# Patient Record
Sex: Female | Born: 1965 | Race: Black or African American | Hispanic: No | Marital: Married | Smoking: Never smoker
Health system: Southern US, Community
[De-identification: ages and names within clinical notes are randomized; demographics above are authoritative.]

## PROBLEM LIST (undated history)

## (undated) HISTORY — PX: DILATION AND CURETTAGE OF UTERUS: SHX78

## (undated) HISTORY — PX: OTHER SURGICAL HISTORY: SHX169

## (undated) HISTORY — PX: LAPAROSCOPIC HYSTERECTOMY: SHX1926

## (undated) HISTORY — PX: HERNIA REPAIR: SHX51

## (undated) HISTORY — PX: ECTOPIC PREGNANCY SURGERY: SHX613

---

## 2020-05-23 ENCOUNTER — Encounter (HOSPITAL_COMMUNITY): Payer: Self-pay | Admitting: Emergency Medicine

## 2020-05-23 ENCOUNTER — Ambulatory Visit (HOSPITAL_COMMUNITY)
Admission: EM | Admit: 2020-05-23 | Discharge: 2020-05-23 | Disposition: A | Discharge status: Home / Self Care | Payer: BC Managed Care – PPO | Attending: Urgent Care | Admitting: Urgent Care

## 2020-05-23 DIAGNOSIS — R030 Elevated blood-pressure reading, without diagnosis of hypertension: Secondary | ICD-10-CM | POA: Insufficient documentation

## 2020-05-23 DIAGNOSIS — Z20822 Contact with and (suspected) exposure to covid-19: Secondary | ICD-10-CM | POA: Insufficient documentation

## 2020-05-23 DIAGNOSIS — Z1152 Encounter for screening for COVID-19: Secondary | ICD-10-CM

## 2020-05-23 LAB — SARS CORONAVIRUS 2 (TAT 6-24 HRS): SARS Coronavirus 2: NEGATIVE

## 2020-05-23 NOTE — ED Provider Notes (Signed)
  [facility]   MRN: [id number] DOB: Apr 03, 1966  Subjective:   Rachel Lynn is a 53 y.o. female presenting for COVID screening. Patient is traveling to [facility].   Taking a weight loss medication.  Allergies  Allergen Reactions  . Hydrocodone     History reviewed. No pertinent past medical history.   History reviewed. No pertinent surgical history.  History reviewed. No pertinent family history.  Social History   Tobacco Use  . Smoking status: Never Smoker  . Smokeless tobacco: Never Used  Substance Use Topics  . Alcohol use: Not Currently  . Drug use: Not on file    Review of Systems  Constitutional: Negative for fever and malaise/fatigue.  HENT: Negative for congestion, ear pain, sinus pain and sore throat.   Eyes: Negative for discharge and redness.  Respiratory: Negative for cough, hemoptysis, shortness of breath and wheezing.   Cardiovascular: Negative for chest pain.  Gastrointestinal: Negative for abdominal pain, diarrhea, nausea and vomiting.  Genitourinary: Negative for dysuria, flank pain and hematuria.  Musculoskeletal: Negative for myalgias.  Skin: Negative for rash.  Neurological: Negative for dizziness, weakness and headaches.  Psychiatric/Behavioral: Negative for depression and substance abuse.     Objective:   Vitals: BP (!) 164/121 (BP Location: Right Arm)   Pulse (!) 104   Temp 98.5 F (36.9 C) (Oral)   Resp 20   SpO2 99%   Wt Readings from Last 3 Encounters:  No data found for Wt   Temp Readings from Last 3 Encounters:  05/23/20 98.5 F (36.9 C) (Oral)   BP Readings from Last 3 Encounters:  05/23/20 (!) 164/121   Pulse Readings from Last 3 Encounters:  05/23/20 (!) 104   BP recheck was 138/91. Pulse was 98.   Physical Exam Constitutional:      General: She is not in acute distress.    Appearance: Normal appearance. She is well-developed. She is not ill-appearing.  HENT:     Head: Normocephalic and atraumatic.       Nose: Nose normal.     Mouth/Throat:     Mouth: Mucous membranes are moist.     Pharynx: Oropharynx is clear.  Eyes:     General: No scleral icterus.    Extraocular Movements: Extraocular movements intact.     Pupils: Pupils are equal, round, and reactive to light.  Cardiovascular:     Rate and Rhythm: Normal rate.  Pulmonary:     Effort: Pulmonary effort is normal.  Skin:    General: Skin is warm and dry.  Neurological:     General: No focal deficit present.     Mental Status: She is alert and oriented to person, place, and time.  Psychiatric:        Mood and Affect: Mood normal.        Behavior: Behavior normal.      Assessment and Plan :   PDMP not reviewed this encounter.  1. Encounter for screening for COVID-19   2. Elevated blood pressure reading     Suspect elevated blood pressure and borderline tachycardia is related to her weight loss medication. Counseled patient on nature of COVID-19 including modes of transmission, diagnostic testing, management and supportive care.  Counseled on medications used for symptomatic relief. COVID 19 testing is pending. Counseled patient on potential for adverse effects with medications prescribed/recommended today, ER and return-to-clinic precautions discussed, patient verbalized understanding.     Wallis Bamberg, [place] 05/23/20 1943

## 2020-05-23 NOTE — ED Triage Notes (Signed)
Pt needs covid test for travel to [facility] on[facility].

## 2020-05-23 NOTE — Discharge Instructions (Signed)

## 2020-08-03 ENCOUNTER — Other Ambulatory Visit: Payer: Self-pay

## 2020-08-03 ENCOUNTER — Ambulatory Visit (HOSPITAL_COMMUNITY)
Admission: EM | Admit: 2020-08-03 | Discharge: 2020-08-03 | Disposition: A | Discharge status: Home / Self Care | Payer: BC Managed Care – PPO | Attending: Family Medicine | Admitting: Family Medicine

## 2020-08-03 DIAGNOSIS — Z20822 Contact with and (suspected) exposure to covid-19: Secondary | ICD-10-CM | POA: Diagnosis not present

## 2020-08-03 NOTE — ED Triage Notes (Signed)
Pt presents with needing covid testing for exposure, denies symptoms.

## 2020-08-03 NOTE — Discharge Instructions (Signed)

## 2020-08-04 LAB — SARS CORONAVIRUS 2 (TAT 6-24 HRS): SARS Coronavirus 2: NEGATIVE

## 2020-08-21 ENCOUNTER — Ambulatory Visit (HOSPITAL_COMMUNITY)
Admission: EM | Admit: 2020-08-21 | Discharge: 2020-08-21 | Disposition: A | Discharge status: Home / Self Care | Payer: BC Managed Care – PPO | Attending: Family Medicine | Admitting: Family Medicine

## 2020-08-21 ENCOUNTER — Other Ambulatory Visit: Payer: Self-pay

## 2020-08-21 ENCOUNTER — Encounter (HOSPITAL_COMMUNITY): Payer: Self-pay | Admitting: *Deleted

## 2020-08-21 ENCOUNTER — Ambulatory Visit (INDEPENDENT_AMBULATORY_CARE_PROVIDER_SITE_OTHER): Payer: BC Managed Care – PPO

## 2020-08-21 DIAGNOSIS — R0789 Other chest pain: Secondary | ICD-10-CM | POA: Diagnosis not present

## 2020-08-21 DIAGNOSIS — M546 Pain in thoracic spine: Secondary | ICD-10-CM | POA: Diagnosis not present

## 2020-08-21 DIAGNOSIS — R079 Chest pain, unspecified: Secondary | ICD-10-CM | POA: Diagnosis not present

## 2020-08-21 MED ORDER — NAPROXEN 500 MG PO TABS
500.0000 mg | ORAL_TABLET | Freq: Two times a day (BID) | ORAL | 0 refills | Status: DC
Start: 2020-08-21 — End: 2020-09-19

## 2020-08-21 MED ORDER — KETOROLAC TROMETHAMINE 30 MG/ML IJ SOLN
30.0000 mg | Freq: Once | INTRAMUSCULAR | Status: AC
  Administered 2020-08-21: 30 mg via INTRAMUSCULAR

## 2020-08-21 MED ORDER — TIZANIDINE HCL 4 MG PO TABS
4.0000 mg | ORAL_TABLET | Freq: Four times a day (QID) | ORAL | 0 refills | Status: AC | PRN
Start: 2020-08-21 — End: ?

## 2020-08-21 MED ORDER — KETOROLAC TROMETHAMINE 30 MG/ML IJ SOLN
INTRAMUSCULAR | Status: AC
  Filled 2020-08-21: qty 1

## 2020-08-21 NOTE — ED Notes (Signed)
EKG given to Dr. Hagler 

## 2020-08-21 NOTE — Discharge Instructions (Signed)
EKG and chest xray normal We gave you toradol for pain Continue with Naprosyn twice daily with food for pain May supplement with tizanidine as needed at home/bedtime, this is a muscle relaxer Gentle stretching and movement of shoulder neck and back Follow-up if not improving or worsening

## 2020-08-21 NOTE — ED Triage Notes (Signed)
Patient in with complaints of right sided chest pain that radiates to back since Saturday. Patient states that pain increases with moving and breathing. Patient states she does have some SOB. Patient denies recent injury. Biofreeze on back and tylenol taken at home.

## 2020-08-21 NOTE — ED Provider Notes (Signed)
[facility]    CSN: [id number] Arrival date & time: 08/21/20  6270      History   Chief Complaint Chief Complaint  Patient presents with   Chest Pain   Shoulder Pain    HPI Rachel Lynn is a 54 y.o. female presenting today for evaluation of chest and shoulder pain.  Patient reports she has had pain in the right side of chest since Saturday and has persisted over the past 2 days.  Pain worse with breathing and movement.  Has had some shortness of breath.  Denies any injury/straining. Symptoms worsening.  Worse with moving right shoulder.  Has tried Tylenol and Biofreeze without relief.  She denies any associate abdominal pain nausea or vomiting.  Did burp, but that worsened the pain at the time, did not feel any relief after.  Denies any correlation to eating.  Denies any recent cough or fevers.  Denies prior DVT/PE.  Denies leg pain or leg swelling.  Does report being on hormone replacement therapy after hysterectomy, but unsure if estrogen.  Denies history of hypertension, diabetes.  Denies tobacco use.  HPI  History reviewed. No pertinent past medical history.  There are no problems to display for this patient.   Past Surgical History:  Procedure Laterality Date   ECTOPIC PREGNANCY SURGERY     HERNIA REPAIR     LAPAROSCOPIC HYSTERECTOMY      OB History   No obstetric history on file.      Home Medications    Prior to Admission medications   Medication Sig Start Date End Date Taking? Authorizing Provider  Ascorbic Acid (VITAMIN C) 100 MG tablet Take 500 mg by mouth daily.   Yes [provider]  cholecalciferol (VITAMIN D3) 25 MCG (1000 UNIT) tablet Take 2,000 Units by mouth daily.   Yes [provider]  estradiol (ESTRACE) 1 MG tablet Take 2 mg by mouth daily.   Yes [provider]  Multiple Vitamin (MULTIVITAMIN WITH MINERALS) TABS tablet Take 1 tablet by mouth daily.   Yes [provider]  NON FORMULARY 2 mg.  Hormone pill name unknown   Yes [provider]  progesterone (ENDOMETRIN) 100 MG vaginal insert Place 100 mg vaginally daily.   Yes [provider]  naproxen (NAPROSYN) 500 MG tablet Take 1 tablet (500 mg total) by mouth 2 (two) times daily. 08/21/20   Zyier Dykema C, PA-C  tiZANidine (ZANAFLEX) 4 MG tablet Take 1 tablet (4 mg total) by mouth every 6 (six) hours as needed for muscle spasms. 08/21/20   Alvina Strother, Junius Creamer, PA-C    Family History Family History  Problem Relation Age of Onset   Hypertension Mother    Heart failure Mother    Hypertension Father    Heart failure Father     Social History Social History   Tobacco Use   Smoking status: Never Smoker   Smokeless tobacco: Never Used  Building services engineer Use: Never used  Substance Use Topics   Alcohol use: Yes    Comment: occ   Drug use: Never     Allergies   Hydrocodone   Review of Systems Review of Systems  Constitutional: Negative for activity change, appetite change, chills, fatigue and fever.  HENT: Negative for congestion, ear pain, rhinorrhea, sinus pressure, sore throat and trouble swallowing.   Eyes: Negative for discharge and redness.  Respiratory: Positive for shortness of breath. Negative for cough and chest tightness.   Cardiovascular: Positive for chest  pain.  Gastrointestinal: Negative for abdominal pain, diarrhea, nausea and vomiting.  Musculoskeletal: Positive for back pain and myalgias.  Skin: Negative for rash.  Neurological: Negative for dizziness, light-headedness and headaches.     Physical Exam Triage Vital Signs ED Triage Vitals [08/21/20 0956]  Enc Vitals Group     BP (!) 166/96     Pulse Rate 95     Resp 16     Temp 97.8 F (36.6 C)     Temp Source Oral     SpO2 100 %     Weight      Height      Head Circumference      Peak Flow      Pain Score      Pain Loc      Pain Edu?      Excl. in GC?    No data found.  Updated Vital Signs BP (!)  166/96 (BP Location: Right Arm)    Pulse 95    Temp 97.8 F (36.6 C) (Oral)    Resp 16    SpO2 100%   Visual Acuity Right Eye Distance:   Left Eye Distance:   Bilateral Distance:    Right Eye Near:   Left Eye Near:    Bilateral Near:     Physical Exam Vitals and nursing note reviewed.  Constitutional:      Appearance: She is well-developed.     Comments: No acute distress  HENT:     Head: Normocephalic and atraumatic.     Ears:     Comments: Bilateral ears without tenderness to palpation of external auricle, tragus and mastoid, EAC's without erythema or swelling, TM's with good bony landmarks and cone of light. Non erythematous.     Nose: Nose normal.     Mouth/Throat:     Comments: Oral mucosa pink and moist, no tonsillar enlargement or exudate. Posterior pharynx patent and nonerythematous, no uvula deviation or swelling. Normal phonation. Eyes:     Conjunctiva/sclera: Conjunctivae normal.  Cardiovascular:     Rate and Rhythm: Normal rate.  Pulmonary:     Effort: Pulmonary effort is normal. No respiratory distress.     Comments: Breathing comfortably at rest, CTABL, no wheezing, rales or other adventitious sounds auscultated  Right anterior chest diffusely tender to palpation Chest:     Chest wall: Tenderness present.  Abdominal:     General: There is no distension.  Musculoskeletal:        General: Normal range of motion.     Cervical back: Neck supple.     Comments: Right shoulder: Nontender palpation along clavicle, AC joint and scapular spine, limited range of motion of shoulder due to pain, full passive range of motion  Tenderness to palpation to periscapular area  Bilateral lower legs symmetric without calf tenderness or swelling  Skin:    General: Skin is warm and dry.     Comments: No overlying rash or discoloration  Neurological:     Mental Status: She is alert and oriented to person, place, and time.      UC Treatments / Results  Labs (all labs  ordered are listed, but only abnormal results are displayed) Labs Reviewed - No data to display  EKG   Radiology DG Chest 2 View  Result Date: 08/21/2020 CLINICAL DATA:  Right chest and back pain for 3 days. EXAM: CHEST - 2 VIEW COMPARISON:  PA and lateral chest 08/24/2009. FINDINGS: The lungs are clear. Heart size is normal.  No pneumothorax or pleural fluid. No acute or focal bony abnormality. IMPRESSION: Negative chest. Electronically Signed   By: Drusilla Kanner M.D.   On: 08/21/2020 11:39    Procedures Procedures (including critical care time)  Medications Ordered in UC Medications  ketorolac (TORADOL) 30 MG/ML injection 30 mg (has no administration in time range)    Initial Impression / Assessment and Plan / UC Course  I have reviewed the triage vital signs and the nursing notes.  Pertinent labs & imaging results that were available during my care of the patient were reviewed by me and considered in my medical decision making (see chart for details).  Clinical Course as of Aug 21 1146  Mon Aug 21, 2020  1020 EKG normal sinus rhythm, 1 PVC, no acute signs of ischemia or infraction   [HW]  1109 BP rechecked 148/94   [HW]    Clinical Course User Index [HW] Shyann Hefner C, PA-C    Symptoms worse with movement and significantly reproducible to palpation, suspect MSK etiology and recommending anti-inflammatories.  Toradol provided prior to discharge.  Checking chest x-ray given symptoms worsening and did have sudden onset.  No hypoxia or tachycardia, no lower leg swelling, lower suspicion of PE.  EKG reassuring, less likely ACS.  Chest x-ray unremarkable.  Continue to monitor,Discussed strict return precautions. Patient verbalized understanding and is agreeable with plan.  Final Clinical Impressions(s) / UC Diagnoses   Final diagnoses:  Chest wall pain     Discharge Instructions     EKG and chest xray normal We gave you toradol for pain Continue with Naprosyn twice  daily with food for pain May supplement with tizanidine as needed at home/bedtime, this is a muscle relaxer Gentle stretching and movement of shoulder neck and back Follow-up if not improving or worsening    ED Prescriptions    Medication Sig Dispense Auth. Provider   naproxen (NAPROSYN) 500 MG tablet Take 1 tablet (500 mg total) by mouth 2 (two) times daily. 30 tablet Kayler Buckholtz C, PA-C   tiZANidine (ZANAFLEX) 4 MG tablet Take 1 tablet (4 mg total) by mouth every 6 (six) hours as needed for muscle spasms. 30 tablet Elyce Zollinger, [place] C, PA-C     PDMP not reviewed this encounter.   Lew Dawes, PA-C 08/21/20 1147

## 2020-08-23 ENCOUNTER — Other Ambulatory Visit: Payer: Self-pay

## 2020-08-23 ENCOUNTER — Encounter (HOSPITAL_COMMUNITY): Payer: Self-pay

## 2020-08-23 ENCOUNTER — Ambulatory Visit (HOSPITAL_COMMUNITY)
Admission: EM | Admit: 2020-08-23 | Discharge: 2020-08-23 | Disposition: A | Discharge status: Home / Self Care | Payer: BC Managed Care – PPO | Attending: Internal Medicine | Admitting: Internal Medicine

## 2020-08-23 DIAGNOSIS — J069 Acute upper respiratory infection, unspecified: Secondary | ICD-10-CM | POA: Diagnosis not present

## 2020-08-23 DIAGNOSIS — Z791 Long term (current) use of non-steroidal anti-inflammatories (NSAID): Secondary | ICD-10-CM | POA: Diagnosis not present

## 2020-08-23 DIAGNOSIS — Z20822 Contact with and (suspected) exposure to covid-19: Secondary | ICD-10-CM | POA: Diagnosis not present

## 2020-08-23 DIAGNOSIS — Z79899 Other long term (current) drug therapy: Secondary | ICD-10-CM | POA: Diagnosis not present

## 2020-08-23 LAB — SARS CORONAVIRUS 2 (TAT 6-24 HRS): SARS Coronavirus 2: NEGATIVE

## 2020-08-23 MED ORDER — ALBUTEROL SULFATE HFA 108 (90 BASE) MCG/ACT IN AERS: 1.0000 | INHALATION_SPRAY | Freq: Four times a day (QID) | RESPIRATORY_TRACT | 0 refills | Status: AC | PRN

## 2020-08-23 MED ORDER — PROMETHAZINE-DM 6.25-15 MG/5ML PO SYRP
5.0000 mL | ORAL_SOLUTION | Freq: Four times a day (QID) | ORAL | 0 refills | Status: AC | PRN
Start: 2020-08-23 — End: ?

## 2020-08-23 NOTE — ED Triage Notes (Signed)
Pt presents with ongoing non productive cough, shortness of breath, and chest pain when coughing for over a week.

## 2020-08-23 NOTE — ED Provider Notes (Signed)
[facility]    CSN: [id number] Arrival date & time: 08/23/20  1220      History   Chief Complaint Chief Complaint  Patient presents with  . Cough  . Shortness of Breath    HPI Rachel Lynn is a 54 y.o. female.   Patient presenting today for about 4 days of hacking cough, SOB, congestion, fatigue. Denies fever, chills, body aches, N/V/D. Was seen for radiating chest to back pain this past weekend and given muscle relaxer and naproxen, notes this is improved but her breathing has gotten worse. EKG and CXR at that time benign. Denies known hx of asthma, COPD and nonsmoker. Known sick contacts recently. So far only taking vitamins and supplements for these sxs.        History reviewed. No pertinent past medical history.  There are no problems to display for this patient.   Past Surgical History:  Procedure Laterality Date  . ECTOPIC PREGNANCY SURGERY    . HERNIA REPAIR    . LAPAROSCOPIC HYSTERECTOMY      OB History   No obstetric history on file.      Home Medications    Prior to Admission medications   Medication Sig Start Date End Date Taking? Authorizing Provider  albuterol (VENTOLIN HFA) 108 (90 Base) MCG/ACT inhaler Inhale 1-2 puffs into the lungs every 6 (six) hours as needed for wheezing or shortness of breath. 08/23/20   Particia Nearing, PA-C  Ascorbic Acid (VITAMIN C) 100 MG tablet Take 500 mg by mouth daily.    [provider]  cholecalciferol (VITAMIN D3) 25 MCG (1000 UNIT) tablet Take 2,000 Units by mouth daily.    [provider]  estradiol (ESTRACE) 1 MG tablet Take 2 mg by mouth daily.    [provider]  Multiple Vitamin (MULTIVITAMIN WITH MINERALS) TABS tablet Take 1 tablet by mouth daily.    [provider]  naproxen (NAPROSYN) 500 MG tablet Take 1 tablet (500 mg total) by mouth 2 (two) times daily. 08/21/20   Wieters, Hallie C, PA-C  NON FORMULARY 2 mg. Hormone pill name unknown    [provider]  progesterone (ENDOMETRIN) 100 MG vaginal insert Place 100 mg vaginally daily.    [provider]  promethazine-dextromethorphan (PROMETHAZINE-DM) 6.25-15 MG/5ML syrup Take 5 mLs by mouth 4 (four) times daily as needed for cough. 08/23/20   Particia Nearing, PA-C  tiZANidine (ZANAFLEX) 4 MG tablet Take 1 tablet (4 mg total) by mouth every 6 (six) hours as needed for muscle spasms. 08/21/20   Wieters, Junius Creamer, PA-C    Family History Family History  Problem Relation Age of Onset  . Hypertension Mother   . Heart failure Mother   . Hypertension Father   . Heart failure Father     Social History Social History   Tobacco Use  . Smoking status: Never Smoker  . Smokeless tobacco: Never Used  Vaping Use  . Vaping Use: Never used  Substance Use Topics  . Alcohol use: Yes    Comment: occ  . Drug use: Never     Allergies   Hydrocodone   Review of Systems Review of Systems PER HPI    Physical Exam Triage Vital Signs ED Triage Vitals  Enc Vitals Group     BP 08/23/20 1246 (!) 155/93     Pulse Rate 08/23/20 1246 (!) 103     Resp 08/23/20 1246 (!) 22     Temp 08/23/20 1246 99 F (  37.2 C)     Temp Source 08/23/20 1246 Oral     SpO2 08/23/20 1246 100 %     Weight --      Height --      Head Circumference --      Peak Flow --      Pain Score 08/23/20 1248 5     Pain Loc --      Pain Edu? --      Excl. in GC? --    No data found.  Updated Vital Signs BP (!) 155/93 (BP Location: Right Arm)   Pulse (!) 103   Temp 99 F (37.2 C) (Oral)   Resp (!) 22   SpO2 100%   Visual Acuity Right Eye Distance:   Left Eye Distance:   Bilateral Distance:    Right Eye Near:   Left Eye Near:    Bilateral Near:     Physical Exam Vitals and nursing note reviewed.  Constitutional:      Appearance: Normal appearance. She is not ill-appearing.  HENT:     Head: Atraumatic.     Right Ear: Tympanic membrane normal.     Left Ear: Tympanic membrane  normal.     Nose: Rhinorrhea present.     Mouth/Throat:     Mouth: Mucous membranes are moist.     Pharynx: Oropharynx is clear. Posterior oropharyngeal erythema present.  Eyes:     Extraocular Movements: Extraocular movements intact.     Conjunctiva/sclera: Conjunctivae normal.  Cardiovascular:     Rate and Rhythm: Normal rate and regular rhythm.     Heart sounds: Normal heart sounds.  Pulmonary:     Effort: Pulmonary effort is normal.     Breath sounds: Normal breath sounds.  Abdominal:     General: Bowel sounds are normal. There is no distension.     Palpations: Abdomen is soft.     Tenderness: There is no abdominal tenderness. There is no guarding.  Musculoskeletal:        General: Normal range of motion.     Cervical back: Normal range of motion and neck supple.  Skin:    General: Skin is warm and dry.  Neurological:     Mental Status: She is alert and oriented to person, place, and time.  Psychiatric:        Mood and Affect: Mood normal.        Thought Content: Thought content normal.        Judgment: Judgment normal.    UC Treatments / Results  Labs (all labs ordered are listed, but only abnormal results are displayed) Labs Reviewed  SARS CORONAVIRUS 2 (TAT 6-24 HRS)    EKG   Radiology No results found.  Procedures Procedures (including critical care time)  Medications Ordered in UC Medications - No data to display  Initial Impression / Assessment and Plan / UC Course  I have reviewed the triage vital signs and the nursing notes.  Pertinent labs & imaging results that were available during my care of the patient were reviewed by me and considered in my medical decision making (see chart for details).     Suspect COVID 19 given sxs and exam as well as recent COVID pos contacts. WIll tx with albuterol inhaler and phenergan DM, mucinex. Isolate until COVID test results return. Strict return precautions reviewed with patient.    Final Clinical  Impressions(s) / UC Diagnoses   Final diagnoses:  Viral URI with cough   Discharge Instructions  None    ED Prescriptions    Medication Sig Dispense Auth. Provider   albuterol (VENTOLIN HFA) 108 (90 Base) MCG/ACT inhaler Inhale 1-2 puffs into the lungs every 6 (six) hours as needed for wheezing or shortness of breath. 18 g Roosvelt Maser [place], [place]   promethazine-dextromethorphan (PROMETHAZINE-DM) 6.25-15 MG/5ML syrup Take 5 mLs by mouth 4 (four) times daily as needed for cough. 100 mL Particia Nearing, [place]     PDMP not reviewed this encounter.   Particia Nearing, [place] 08/23/20 1540

## 2020-09-19 ENCOUNTER — Encounter: Payer: Self-pay | Admitting: Emergency Medicine

## 2020-09-19 ENCOUNTER — Ambulatory Visit (INDEPENDENT_AMBULATORY_CARE_PROVIDER_SITE_OTHER): Payer: BC Managed Care – PPO

## 2020-09-19 ENCOUNTER — Other Ambulatory Visit: Payer: Self-pay

## 2020-09-19 ENCOUNTER — Ambulatory Visit
Admission: EM | Admit: 2020-09-19 | Discharge: 2020-09-19 | Disposition: A | Discharge status: Home / Self Care | Payer: BC Managed Care – PPO | Attending: Emergency Medicine | Admitting: Emergency Medicine

## 2020-09-19 DIAGNOSIS — W19XXXA Unspecified fall, initial encounter: Secondary | ICD-10-CM

## 2020-09-19 DIAGNOSIS — M79601 Pain in right arm: Secondary | ICD-10-CM | POA: Diagnosis not present

## 2020-09-19 DIAGNOSIS — M7711 Lateral epicondylitis, right elbow: Secondary | ICD-10-CM

## 2020-09-19 MED ORDER — IBUPROFEN 800 MG PO TABS
800.0000 mg | ORAL_TABLET | Freq: Three times a day (TID) | ORAL | 0 refills | Status: DC
Start: 2020-09-19 — End: 2020-10-10

## 2020-09-19 MED ORDER — PREDNISONE 10 MG PO TABS: ORAL_TABLET | ORAL | 0 refills | Status: AC

## 2020-09-19 NOTE — ED Triage Notes (Signed)
Pain to RT forearm with movement since the beginning of the month.  Pt states she used her arm to break a fall and has had pain that has continued to get worse.

## 2020-09-19 NOTE — Discharge Instructions (Addendum)
Begin prednisone taper over the next 6 days-begin with 6 tablets on day 1, decrease by 1 tablet each day until complete-6, 5, 4, 3, 2, 1-take with food and in the morning After completion of prednisone course may use Tylenol/ibuprofen Ice and rest arm Wear brace over the next 1 to 2 weeks to help decrease inflammation around tendons of forearm Follow-up with sports medicine if not improving or worsening

## 2020-09-19 NOTE — ED Provider Notes (Signed)
[facility]    CSN: [id number] Arrival date & time: 09/19/20  1138      History   Chief Complaint Chief Complaint  Patient presents with   Arm Injury    HPI Rachel Lynn is a 54 y.o. female presenting today for evaluation of forearm injury.  Patient reports at the beginning of the month approximately 20 days ago she reached out to help prevent someone else from falling.  Denies immediate pain, but since she has developed persistent and continuous pain to her proximal forearm near her elbow.  Reports pain with supination and pronation as well as movement at her wrist.  Denies numbness or tingling.  HPI  History reviewed. No pertinent past medical history.  There are no problems to display for this patient.   Past Surgical History:  Procedure Laterality Date   ECTOPIC PREGNANCY SURGERY     HERNIA REPAIR     LAPAROSCOPIC HYSTERECTOMY      OB History   No obstetric history on file.      Home Medications    Prior to Admission medications   Medication Sig Start Date End Date Taking? Authorizing Provider  albuterol (VENTOLIN HFA) 108 (90 Base) MCG/ACT inhaler Inhale 1-2 puffs into the lungs every 6 (six) hours as needed for wheezing or shortness of breath. 08/23/20   Particia Nearing, PA-C  Ascorbic Acid (VITAMIN C) 100 MG tablet Take 500 mg by mouth daily.    [provider]  cholecalciferol (VITAMIN D3) 25 MCG (1000 UNIT) tablet Take 2,000 Units by mouth daily.    [provider]  estradiol (ESTRACE) 1 MG tablet Take 2 mg by mouth daily.    [provider]  ibuprofen (ADVIL) 800 MG tablet Take 1 tablet (800 mg total) by mouth 3 (three) times daily. 09/19/20   Tymarion Everard C, PA-C  Multiple Vitamin (MULTIVITAMIN WITH MINERALS) TABS tablet Take 1 tablet by mouth daily.    [provider]  NON FORMULARY 2 mg. Hormone pill name unknown    [provider]  predniSONE (DELTASONE) 10 MG tablet Begin with 6  tabs on day 1, 5 tab on day 2, 4 tab on day 3, 3 tab on day 4, 2 tab on day 5, 1 tab on day 6-take with food 09/19/20   Paige Vanderwoude, [employer] C, PA-C  progesterone (ENDOMETRIN) 100 MG vaginal insert Place 100 mg vaginally daily.    [provider]  promethazine-dextromethorphan (PROMETHAZINE-DM) 6.25-15 MG/5ML syrup Take 5 mLs by mouth 4 (four) times daily as needed for cough. 08/23/20   Particia Nearing, PA-C  tiZANidine (ZANAFLEX) 4 MG tablet Take 1 tablet (4 mg total) by mouth every 6 (six) hours as needed for muscle spasms. 08/21/20   Lennell Shanks, Junius Creamer, PA-C    Family History Family History  Problem Relation Age of Onset   Hypertension Mother    Heart failure Mother    Hypertension Father    Heart failure Father     Social History Social History   Tobacco Use   Smoking status: Never Smoker   Smokeless tobacco: Never Used  Building services engineer Use: Never used  Substance Use Topics   Alcohol use: Yes    Comment: occ   Drug use: Never     Allergies   Hydrocodone   Review of Systems Review of Systems  Constitutional: Negative for fatigue and fever.  Eyes: Negative for visual disturbance.  Respiratory: Negative for shortness of breath.   Cardiovascular:  Negative for chest pain.  Gastrointestinal: Negative for abdominal pain, nausea and vomiting.  Musculoskeletal: Positive for arthralgias and myalgias. Negative for joint swelling.  Skin: Negative for color change, rash and wound.  Neurological: Negative for dizziness, weakness, light-headedness and headaches.     Physical Exam Triage Vital Signs ED Triage Vitals  Enc Vitals Group     BP 09/19/20 1241 (!) 154/95     Pulse Rate 09/19/20 1241 86     Resp 09/19/20 1241 19     Temp 09/19/20 1241 98.4 F (36.9 C)     Temp Source 09/19/20 1241 Oral     SpO2 09/19/20 1241 97 %     Weight 09/19/20 1205 198 lb (89.8 kg)     Height 09/19/20 1205 5\' 5"  (1.651 m)     Head Circumference --      Peak Flow --       Pain Score 09/19/20 1205 8     Pain Loc --      Pain Edu? --      Excl. in GC? --    No data found.  Updated Vital Signs BP (!) 154/95 (BP Location: Right Arm)    Pulse 86    Temp 98.4 F (36.9 C) (Oral)    Resp 19    Ht 5\' 5"  (1.651 m)    Wt 198 lb (89.8 kg)    SpO2 97%    BMI 32.95 kg/m   Visual Acuity Right Eye Distance:   Left Eye Distance:   Bilateral Distance:    Right Eye Near:   Left Eye Near:    Bilateral Near:     Physical Exam Vitals and nursing note reviewed.  Constitutional:      Appearance: She is well-developed.     Comments: No acute distress  HENT:     Head: Normocephalic and atraumatic.     Nose: Nose normal.  Eyes:     Conjunctiva/sclera: Conjunctivae normal.  Cardiovascular:     Rate and Rhythm: Normal rate.  Pulmonary:     Effort: Pulmonary effort is normal. No respiratory distress.  Abdominal:     General: There is no distension.  Musculoskeletal:        General: Normal range of motion.     Cervical back: Neck supple.     Comments: Right elbow: Full active range of motion, nontender to palpation over olecranon, tender to palpation over lateral epicondyle area and proximal forearm musculature bilaterally, also tender near proximal forearm near antecubital area, less tender to medial aspect of elbow  Right wrist: Nontender to palpation of distal radius and ulna, full active range of motion, radial pulse 2+  Grip strength intact  Skin:    General: Skin is warm and dry.  Neurological:     Mental Status: She is alert and oriented to person, place, and time.      UC Treatments / Results  Labs (all labs ordered are listed, but only abnormal results are displayed) Labs Reviewed - No data to display  EKG   Radiology DG Forearm Right  Result Date: 09/19/2020 CLINICAL DATA:  Right arm pain after a fall 1 month ago. EXAM: RIGHT FOREARM - 2 VIEW COMPARISON:  None. FINDINGS: There is no evidence of fracture or other focal bone lesions.  Soft tissues are unremarkable. IMPRESSION: Negative. Electronically Signed   By: M.D.   On: 09/19/2020 12:33    Procedures Procedures (including critical care time)  Medications Ordered in UC Medications -  No data to display  Initial Impression / Assessment and Plan / UC Course  I have reviewed the triage vital signs and the nursing notes.  Pertinent labs & imaging results that were available during my care of the patient were reviewed by me and considered in my medical decision making (see chart for details).     X-ray negative for acute bony abnormality.  Suspect most likely lateral epicondylitis/tendinitis, placing on 6-day course of prednisone followed by NSAIDs.  Providing brace to help decrease inflammation/limit aggravating motions.  Follow-up with sports medicine if persisting.  Discussed strict return precautions. Patient verbalized understanding and is agreeable with plan.  Final Clinical Impressions(s) / UC Diagnoses   Final diagnoses:  Lateral epicondylitis of right elbow     Discharge Instructions     Begin prednisone taper over the next 6 days-begin with 6 tablets on day 1, decrease by 1 tablet each day until complete-6, 5, 4, 3, 2, 1-take with food and in the morning After completion of prednisone course may use Tylenol/ibuprofen Ice and rest arm Wear brace over the next 1 to 2 weeks to help decrease inflammation around tendons of forearm Follow-up with sports medicine if not improving or worsening   ED Prescriptions    Medication Sig Dispense Auth. Provider   predniSONE (DELTASONE) 10 MG tablet Begin with 6 tabs on day 1, 5 tab on day 2, 4 tab on day 3, 3 tab on day 4, 2 tab on day 5, 1 tab on day 6-take with food 21 tablet Kinzi Frediani C, PA-C   ibuprofen (ADVIL) 800 MG tablet Take 1 tablet (800 mg total) by mouth 3 (three) times daily. 21 tablet Elvyn Krohn, [place] C, PA-C     PDMP not reviewed this encounter.   Lew Dawes,  [place] 09/19/20 1257

## 2020-10-03 ENCOUNTER — Other Ambulatory Visit: Payer: Self-pay

## 2020-10-03 ENCOUNTER — Ambulatory Visit: Payer: BC Managed Care – PPO | Admitting: Sports Medicine

## 2020-10-03 VITALS — BP 136/92 | Ht 65.0 in | Wt 193.0 lb

## 2020-10-03 DIAGNOSIS — M25521 Pain in right elbow: Secondary | ICD-10-CM | POA: Insufficient documentation

## 2020-10-03 NOTE — Patient Instructions (Addendum)
We will schedule you for an MRI of your elbow and forearm to see if you have a tear in the area.    You can continue to work if you aren't doing heavy lifting and can tolerate it.

## 2020-10-03 NOTE — Progress Notes (Signed)
Rachel Lynn is a 54 y.o. female who presents to [facility] today for the following:  Right Forearm/Elbow Pain Patient reports that at the beginning of September she was watching her friends father when he started to fall and she hurriedly caught him, placing him to the floor States that afterward she did not have immediate pain, did notice a small amount of swelling in her forearm Started to have pain a few weeks later, around the beginning of October Has continued to have pain and swelling in her anterior elbow/forearm since then She was seen in urgent care on 10/26 and diagnosed with lateral epicondylitis, given a steroid burst pack and had at some improvement with this in regards to pain and some swelling Denies any bruising to the area States that she is right-handed and has pain anytime she is using her hand, specifically when she is pronating, pushing, picking things up, holding things Also reports that she has numbness and tingling that radiates into all 5 fingers of her palm   PMH reviewed.  ROS as above. Medications reviewed.  Exam:  BP (!) 136/92   Ht 5\' 5"  (1.651 m)   Wt 193 lb (87.5 kg)   BMI 32.12 kg/m  Gen: Well NAD MSK:  Right Elbow: - Inspection: She has obvious swelling of her anterior elbow in the region of brachioradialis on right.  No abnormalities noted on left.  No overlying ecchymosis. - Palpation: Tenderness to light palpation along anterior right elbow and moving distally into her proximal forearm, no tenderness palpation specifically over medial and lateral epicondyles - ROM: Right elbow is limited in extension to 170 degrees, otherwise intact, she has pain with range of motion in all fields aside from supination on right, full ROM on left. - Strength: 4/5 strength in pronation and flexion of right arm, 5/5 strength in all other fields b/l.  OK sign intact, but painful on right.  5/5 interosseus strength on right. - Neuro: NV intact distally b/l - Special  testing: Negative hook test   DG Forearm Right  Result Date: 09/19/2020 CLINICAL DATA:  Right arm pain after a fall 1 month ago. EXAM: RIGHT FOREARM - 2 VIEW COMPARISON:  None. FINDINGS: There is no evidence of fracture or other focal bone lesions. Soft tissues are unremarkable. IMPRESSION: Negative. Electronically Signed   By: 09/21/2020 M.D.   On: 09/19/2020 12:33   Limited Right Anterior Elbow 09/21/2020: Biceps tendon visualized in long and short axis without obvious disruption of fibers.  Hypoechoic heterogenous changes noted deep to biceps tendon.  Brachioradialis also observed in long and short avis with some hypoechoic disruption of muscle fibers noted.  Dopper without increased flow noted. Impression: Questionable partial biceps tendon tear versus muscle tear vs hematoma  Assessment and Plan: 1) Right elbow pain Patient has a negative hook test on examination, therefore no complete rupture of biceps tendon suspected.  Given that she does have some hypoechoic heterogenous changes deep to the biceps tendon, it is possible that she has a partial biceps tendon rupture.  With her point tenderness and pain with pronation, it is also possible that she has a muscle tear and pronator teres or brachioradialis.  Hematoma is also on the differential given appearance on ultrasound.  She has had negative x-rays at urgent care and given chronicity, will go ahead and order MRI given that she continues to have significant symptoms.  We will have her follow-up after MRI and repeat ultrasound to correlate.  She can continue  to work as long as she is not doing heavy lifting.  Can ice and take Tylenol or ibuprofen as needed for pain in the meantime.   Luis Abed, D.O.  PGY-3 Family Medicine  10/03/2020 4:41 PM   Patient seen and evaluated with the resident.  I agree with the above plan of care.  Ultrasound today was inconclusive and there is concern for possible muscle or tendon injury.  Therefore, we  will proceed with an MRI to evaluate further.  She will follow-up next week to discuss those findings and we will delineate further treatment based on those results.

## 2020-10-03 NOTE — Assessment & Plan Note (Signed)
Patient has a negative hook test on examination, therefore no complete rupture of biceps tendon suspected.  Given that she does have some hypoechoic heterogenous changes deep to the biceps tendon, it is possible that she has a partial biceps tendon rupture.  With her point tenderness and pain with pronation, it is also possible that she has a muscle tear and pronator teres or brachioradialis.  Hematoma is also on the differential given appearance on ultrasound.  She has had negative x-rays at urgent care and given chronicity, will go ahead and order MRI given that she continues to have significant symptoms.  We will have her follow-up after MRI and repeat ultrasound to correlate.  She can continue to work as long as she is not doing heavy lifting.  Can ice and take Tylenol or ibuprofen as needed for pain in the meantime.

## 2020-10-04 ENCOUNTER — Encounter: Payer: Self-pay | Admitting: Sports Medicine

## 2020-10-04 DIAGNOSIS — S56211A Strain of other flexor muscle, fascia and tendon at forearm level, right arm, initial encounter: Secondary | ICD-10-CM | POA: Diagnosis not present

## 2020-10-04 DIAGNOSIS — M25521 Pain in right elbow: Secondary | ICD-10-CM | POA: Diagnosis not present

## 2020-10-09 ENCOUNTER — Encounter: Payer: Self-pay | Admitting: Internal Medicine

## 2020-10-09 ENCOUNTER — Ambulatory Visit (INDEPENDENT_AMBULATORY_CARE_PROVIDER_SITE_OTHER): Payer: BC Managed Care – PPO | Admitting: Internal Medicine

## 2020-10-09 ENCOUNTER — Other Ambulatory Visit: Payer: Self-pay

## 2020-10-09 VITALS — BP 134/88 | HR 105 | Temp 98.2°F | Resp 18 | Ht 64.0 in | Wt 196.0 lb

## 2020-10-09 DIAGNOSIS — R030 Elevated blood-pressure reading, without diagnosis of hypertension: Secondary | ICD-10-CM | POA: Diagnosis not present

## 2020-10-09 DIAGNOSIS — Z23 Encounter for immunization: Secondary | ICD-10-CM | POA: Diagnosis not present

## 2020-10-09 DIAGNOSIS — Z7689 Persons encountering health services in other specified circumstances: Secondary | ICD-10-CM | POA: Diagnosis not present

## 2020-10-09 DIAGNOSIS — R232 Flushing: Secondary | ICD-10-CM

## 2020-10-09 DIAGNOSIS — Z1231 Encounter for screening mammogram for malignant neoplasm of breast: Secondary | ICD-10-CM

## 2020-10-09 DIAGNOSIS — G4700 Insomnia, unspecified: Secondary | ICD-10-CM | POA: Diagnosis not present

## 2020-10-09 DIAGNOSIS — Z780 Asymptomatic menopausal state: Secondary | ICD-10-CM

## 2020-10-09 DIAGNOSIS — M79631 Pain in right forearm: Secondary | ICD-10-CM

## 2020-10-09 MED ORDER — HYDROXYZINE HCL 25 MG PO TABS: 25.0000 mg | ORAL_TABLET | Freq: Every evening | ORAL | 3 refills | Status: AC | PRN

## 2020-10-09 NOTE — Patient Instructions (Signed)
Please continue to take Ibuprofen and Xanaflex as needed for arm pain/muscle spasms.  Please get blood tests done, in the morning before breakfast, preferably within a week.  You are being referred to OB/GYN for evaluation of hot flashes and hormone replacement discussion.  Please follo DASH diet and perform moderate exercise/walking at least 150 mins/week.  DASH stands for Dietary Approaches to Stop Hypertension. The DASH diet is a healthy-eating plan designed to help treat or prevent high blood pressure (hypertension).  The DASH diet includes foods that are rich in potassium, calcium and magnesium. These nutrients help control blood pressure. The diet limits foods that are high in sodium, saturated fat and added sugars.  Studies have shown that the DASH diet can lower blood pressure in as little as two weeks. The diet can also lower low-density lipoprotein (LDL or "bad") cholesterol levels in the blood. High blood pressure and high LDL cholesterol levels are two major risk factors for heart disease and stroke.    DASH diet: Recommended servings The DASH diet provides daily and weekly nutritional goals. The number of servings you should have depends on your daily calorie needs.  Here's a look at the recommended servings from each food group for a 2,000-calorie-a-day DASH diet:  Grains: 6 to 8 servings a day. One serving is one slice bread, 1 ounce dry cereal, or 1/2 cup cooked cereal, rice or pasta. Vegetables: 4 to 5 servings a day. One serving is 1 cup raw leafy green vegetable, 1/2 cup cut-up raw or cooked vegetables, or 1/2 cup vegetable juice. Fruits: 4 to 5 servings a day. One serving is one medium fruit, 1/2 cup fresh, frozen or canned fruit, or 1/2 cup fruit juice. Fat-free or low-fat dairy products: 2 to 3 servings a day. One serving is 1 cup milk or yogurt, or 1 1/2 ounces cheese. Lean meats, poultry and fish: six 1-ounce servings or fewer a day. One serving is 1 ounce cooked  meat, poultry or fish, or 1 egg. Nuts, seeds and legumes: 4 to 5 servings a week. One serving is 1/3 cup nuts, 2 tablespoons peanut butter, 2 tablespoons seeds, or 1/2 cup cooked legumes (dried beans or peas). Fats and oils: 2 to 3 servings a day. One serving is 1 teaspoon soft margarine, 1 teaspoon vegetable oil, 1 tablespoon mayonnaise or 2 tablespoons salad dressing. Sweets and added sugars: 5 servings or fewer a week. One serving is 1 tablespoon sugar, jelly or jam, 1/2 cup sorbet, or 1 cup lemonade.  Please maintain simple sleep hygiene. - Maintain dark and non-noisy environment in the bedroom. - Please use the bedroom for sleep and sexual activity only. - Do not use electronic devices in the bedroom. - Please take dinner at least 2 hours before bedtime. - Please avoid caffeinated products in the evening, including coffee, soft drinks. - Please try to maintain the regular sleep-wake cycle - Go to bed and wake up at the same time.

## 2020-10-10 ENCOUNTER — Ambulatory Visit: Payer: BC Managed Care – PPO | Admitting: Sports Medicine

## 2020-10-10 VITALS — BP 148/96 | Ht 64.5 in | Wt 196.0 lb

## 2020-10-10 DIAGNOSIS — Z7689 Persons encountering health services in other specified circumstances: Secondary | ICD-10-CM | POA: Insufficient documentation

## 2020-10-10 DIAGNOSIS — G4700 Insomnia, unspecified: Secondary | ICD-10-CM | POA: Insufficient documentation

## 2020-10-10 DIAGNOSIS — R232 Flushing: Secondary | ICD-10-CM | POA: Insufficient documentation

## 2020-10-10 DIAGNOSIS — M79631 Pain in right forearm: Secondary | ICD-10-CM

## 2020-10-10 DIAGNOSIS — Z780 Asymptomatic menopausal state: Secondary | ICD-10-CM | POA: Insufficient documentation

## 2020-10-10 DIAGNOSIS — R030 Elevated blood-pressure reading, without diagnosis of hypertension: Secondary | ICD-10-CM | POA: Insufficient documentation

## 2020-10-10 MED ORDER — DICLOFENAC SODIUM 75 MG PO TBEC: 75.0000 mg | DELAYED_RELEASE_TABLET | Freq: Two times a day (BID) | ORAL | 0 refills | Status: AC

## 2020-10-10 NOTE — Assessment & Plan Note (Signed)
BP Readings from Last 1 Encounters:  10/09/20 134/88   Advised DASH diet and moderate exercise/walking, at least 150 mins/week

## 2020-10-10 NOTE — Assessment & Plan Note (Signed)
Takes Estradiol and Progesterone OB/GYN referral provided as per patient request

## 2020-10-10 NOTE — Patient Instructions (Addendum)
We are going to refer you to Dr. Everardo Pacific for your right arm bicep tendon tear.  [facility] [address] [place], [place] [phone]  Appt: 10/17/20 @ 8:45 am. Please arrive at 8:30 am.  We have sent diclofenac to the pharmacy for you.  You can take this every 12 hrs as needed for pain.  Do not take with ibuprofen, advil, or aleve.

## 2020-10-10 NOTE — Assessment & Plan Note (Signed)
On HRT Still c/o hot flashes, OB/GYN referral provided.

## 2020-10-10 NOTE — Progress Notes (Signed)
New Patient Office Visit  Subjective:  Patient ID: Rachel Lynn, female    DOB: 11/18/1966  Age: 54 y.o. MRN: [id number]  CC:  Chief Complaint  Patient presents with  . New Patient (Initial Visit)  . Arm Pain    R arm; possible muscle tear.     HPI Rachel Lynn is a 54 year old female with PMH of hot flashes related to menopause presents for establishing care.  She complains of right forearm pain, which is constant, sharp, 5-8/10, worse with pronation and better with rest.  She denies any numbness, weakness or tingling in the right upper extremity.  She denies any known injury.  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  Patient follows up with sports medicine specialist and is undergoing evaluation for the pain.  She had an MRI of the right forearm, for which she has a follow-up appointment with the sports medicine specialist.  Patient complains of hot flashes related to menopause.  She takes estrogen and progesterone orally, but still has some hot flashes.  She requests OB/GYN referral for discussing HRT.  Patient also complains of sleeping difficulty.  She denies any anhedonia, recent weight or appetite change or suicidal ideation.  Patient had colonoscopy in 2019, which was normal according to the patient.  Patient has had both doses of Covid vaccine.  She received flu vaccine in the office today.  No past medical history on file.  Past Surgical History:  Procedure Laterality Date  . ECTOPIC PREGNANCY SURGERY    . HERNIA REPAIR    . LAPAROSCOPIC HYSTERECTOMY      Family History  Problem Relation Age of Onset  . Hypertension Mother   . Heart failure Mother   . Hypertension Father   . Heart failure Father     Social History   Socioeconomic History  . Marital status: Married    Spouse name: Not on file  . Number of children: Not on file  . Years of education: Not on file  . Highest education  level: Not on file  Occupational History  . Not on file  Tobacco Use  . Smoking status: Never Smoker  . Smokeless tobacco: Never Used  Vaping Use  . Vaping Use: Never used  Substance and Sexual Activity  . Alcohol use: Yes    Comment: occ  . Drug use: Never  . Sexual activity: Yes  Other Topics Concern  . Not on file  Social History Narrative  . Not on file   Social Determinants of Health   Financial Resource Strain:   . Difficulty of Paying Living Expenses: Not on file  Food Insecurity:   . Worried About Charity fundraiser in the Last Year: Not on file  . Ran Out of Food in the Last Year: Not on file  Transportation Needs:   . Lack of Transportation (Medical): Not on file  . Lack of Transportation (Non-Medical): Not on file  Physical Activity:   . Days of Exercise per Week: Not on file  . Minutes of Exercise per Session: Not on file  Stress:   . Feeling of Stress : Not on file  Social Connections:   . Frequency of Communication with Friends and Family: Not on file  . Frequency of Social Gatherings with Friends and Family: Not on file  . Attends Religious Services: Not on file  . Active Member of Clubs or  Organizations: Not on file  . Attends Archivist Meetings: Not on file  . Marital Status: Not on file  Intimate Partner Violence:   . Fear of Current or Ex-Partner: Not on file  . Emotionally Abused: Not on file  . Physically Abused: Not on file  . Sexually Abused: Not on file    ROS Review of Systems  Constitutional: Negative for chills and fever.  HENT: Negative for congestion, sinus pressure, sinus pain and sore throat.   Eyes: Negative for pain and discharge.  Respiratory: Negative for cough and shortness of breath.   Cardiovascular: Negative for chest pain and palpitations.  Gastrointestinal: Negative for abdominal pain, constipation, diarrhea, nausea and vomiting.  Endocrine: Positive for heat intolerance. Negative for polydipsia and polyuria.    Genitourinary: Negative for dysuria and hematuria.  Musculoskeletal: Negative for neck pain and neck stiffness.       Right forearm pain and swelling  Skin: Negative for rash.  Neurological: Negative for dizziness, syncope, weakness and numbness.  Psychiatric/Behavioral: Positive for sleep disturbance. Negative for agitation, behavioral problems and dysphoric mood. The patient is not nervous/anxious.     Objective:   Today's Vitals: BP 134/88   Pulse (!) 105   Temp 98.2 F (36.8 C)   Resp 18   Ht 5' 4"  (1.626 m)   Wt 196 lb (88.9 kg)   SpO2 97%   BMI 33.64 kg/m   Physical Exam Vitals reviewed.  Constitutional:      General: She is not in acute distress.    Appearance: She is not diaphoretic.  HENT:     Head: Normocephalic and atraumatic.     Nose: Nose normal.     Mouth/Throat:     Mouth: Mucous membranes are moist.  Eyes:     General: No scleral icterus.    Extraocular Movements: Extraocular movements intact.     Pupils: Pupils are equal, round, and reactive to light.  Cardiovascular:     Rate and Rhythm: Normal rate and regular rhythm.     Pulses: Normal pulses.     Heart sounds: Normal heart sounds. No murmur heard.   Pulmonary:     Breath sounds: Normal breath sounds. No wheezing or rales.  Abdominal:     Palpations: Abdomen is soft.     Tenderness: There is no abdominal tenderness.  Musculoskeletal:     Right forearm: Swelling present. No tenderness or bony tenderness.     Left forearm: No swelling, tenderness or bony tenderness.       Arms:     Cervical back: Neck supple. No tenderness.     Right lower leg: No edema.     Left lower leg: No edema.  Skin:    General: Skin is warm.     Findings: No rash.  Neurological:     General: No focal deficit present.     Mental Status: She is alert and oriented to person, place, and time.  Psychiatric:        Mood and Affect: Mood normal.        Behavior: Behavior normal.     Assessment & Plan:   Problem  List Items Addressed This Visit      Encounter to establish care - Primary   Care established Previous chart reviewed History and medications reviewed with the patient     Relevant Orders  CBC with Differential  CMP14+EGFR  Lipid Profile  TSH + free T4  Vitamin D (25 hydroxy)  Cardiovascular and Mediastinum   Hot flashes    Takes Estradiol and Progesterone OB/GYN referral provided as per patient request      Relevant Orders   Ambulatory referral to Obstetrics / Gynecology     Other      Prehypertension    BP Readings from Last 1 Encounters:  10/09/20 134/88   Advised DASH diet and moderate exercise/walking, at least 150 mins/week       Insomnia    Atarax PRN Discussed measures to improve sleep hygiene      Relevant Medications   hydrOXYzine (ATARAX/VISTARIL) 25 MG tablet   Right forearm pain    Likely Pronator muscle/tendon injury Follows up with sports medicine specialist, undergoing evaluation Xanaflex and Ibuprofen PRN      Menopause    On HRT Still c/o hot flashes, OB/GYN referral provided.       Other Visit Diagnoses    Screening mammogram for breast cancer       Relevant Orders   MM Digital Screening   Need for immunization against influenza       Relevant Orders   Flu Vaccine QUAD 36+ mos IM (Completed)      Outpatient Encounter Medications as of 10/09/2020  Medication Sig  . albuterol (VENTOLIN HFA) 108 (90 Base) MCG/ACT inhaler Inhale 1-2 puffs into the lungs every 6 (six) hours as needed for wheezing or shortness of breath.  . Ascorbic Acid (VITAMIN C) 100 MG tablet Take 500 mg by mouth daily.  . cholecalciferol (VITAMIN D3) 25 MCG (1000 UNIT) tablet Take 2,000 Units by mouth daily.  Marland Kitchen estradiol (ESTRACE) 1 MG tablet Take 2 mg by mouth daily.  Marland Kitchen ibuprofen (ADVIL) 800 MG tablet Take 1 tablet (800 mg total) by mouth 3 (three) times daily.  . Multiple Vitamin (MULTIVITAMIN WITH MINERALS) TABS tablet Take 1 tablet by mouth daily.  . NON  FORMULARY 2 mg. Hormone pill name unknown  . predniSONE (DELTASONE) 10 MG tablet Begin with 6 tabs on day 1, 5 tab on day 2, 4 tab on day 3, 3 tab on day 4, 2 tab on day 5, 1 tab on day 6-take with food  . progesterone (ENDOMETRIN) 100 MG vaginal insert 100 mg daily. PO  . promethazine-dextromethorphan (PROMETHAZINE-DM) 6.25-15 MG/5ML syrup Take 5 mLs by mouth 4 (four) times daily as needed for cough.  Marland Kitchen tiZANidine (ZANAFLEX) 4 MG tablet Take 1 tablet (4 mg total) by mouth every 6 (six) hours as needed for muscle spasms.  . hydrOXYzine (ATARAX/VISTARIL) 25 MG tablet Take 1 tablet (25 mg total) by mouth at bedtime as needed for anxiety (Sleep).   No facility-administered encounter medications on file as of 10/09/2020.    Follow-up: Return in about 3 months (around 01/09/2021).   Lindell Spar, MD

## 2020-10-10 NOTE — Assessment & Plan Note (Signed)
Atarax PRN Discussed measures to improve sleep hygiene

## 2020-10-10 NOTE — Assessment & Plan Note (Signed)
Likely Pronator muscle/tendon injury Follows up with sports medicine specialist, undergoing evaluation Xanaflex and Ibuprofen PRN

## 2020-10-10 NOTE — Assessment & Plan Note (Signed)
Care established Previous chart reviewed History and medications reviewed with the patient 

## 2020-10-11 ENCOUNTER — Encounter: Payer: Self-pay | Admitting: Sports Medicine

## 2020-10-11 NOTE — Progress Notes (Signed)
Patient ID: Rachel Lynn, female   DOB: [birth year], 54 y.o.   MRN: [id number]  Patient comes in today to discuss MRI findings of the right elbow.  MRI shows a moderate partial tear and severe tendinosis with accompanying edema involving the distal biceps tendon insertion.  Patient's injury occurred several weeks ago.  She continues to have disabling pain despite relative rest and conservative treatment.  This may need operative intervention.  Therefore, I referred the patient to Dr. Everardo Pacific to discuss possible surgery.  She has an appointment with him next week.  In the meantime she will be out of work and I will give her a sling for comfort.  We will also prescribe some Voltaren 75 mg for her to take twice daily as needed for pain.  Further work-up and treatment will be per the discretion of Dr. Everardo Pacific.  Patient will follow up with me as needed.

## 2020-10-12 ENCOUNTER — Encounter: Payer: Self-pay | Admitting: Sports Medicine

## 2020-10-17 DIAGNOSIS — M25521 Pain in right elbow: Secondary | ICD-10-CM | POA: Diagnosis not present

## 2020-10-26 DIAGNOSIS — M25521 Pain in right elbow: Secondary | ICD-10-CM | POA: Diagnosis not present

## 2020-11-03 ENCOUNTER — Encounter: Payer: Self-pay | Admitting: Adult Health

## 2020-11-03 ENCOUNTER — Other Ambulatory Visit: Payer: Self-pay

## 2020-11-03 ENCOUNTER — Ambulatory Visit (INDEPENDENT_AMBULATORY_CARE_PROVIDER_SITE_OTHER): Payer: BC Managed Care – PPO | Admitting: Adult Health

## 2020-11-03 VITALS — BP 137/87 | HR 76 | Ht 64.0 in | Wt 198.0 lb

## 2020-11-03 DIAGNOSIS — R232 Flushing: Secondary | ICD-10-CM

## 2020-11-03 DIAGNOSIS — R61 Generalized hyperhidrosis: Secondary | ICD-10-CM | POA: Diagnosis not present

## 2020-11-03 DIAGNOSIS — N898 Other specified noninflammatory disorders of vagina: Secondary | ICD-10-CM

## 2020-11-03 MED ORDER — ESTROGEL 0.75 MG/1.25 GM (0.06%) TD GEL: 1.2500 g | Freq: Every day | TRANSDERMAL | 0 refills | Status: AC

## 2020-11-03 NOTE — Progress Notes (Signed)
  Subjective:     Patient ID: Rachel Lynn, female   DOB: [birth year], 54 y.o.   MRN: [id number]  HPI Rachel Lynn is a 54 year old black female, married, sp hysterectomy in complaining of hot flashes and night sweats and vaginal dryness, even with estrace 2 mg and Prometrium, and does not sleep well. Years ago she used sub Q hormone pellets and that worked well.  PCP is Dr Allena Katz.  Review of Systems + hot flashes +night sweats +vaginal dryness Decrease libido Does not sleep well Reviewed past medical,surgical, social and family history. Reviewed medications and allergies.     Objective:   Physical Exam BP 137/87 (BP Location: Left Arm, Patient Position: Sitting, Cuff Size: Normal)   Pulse 76   Ht 5\' 4"  (1.626 m)   Wt 198 lb (89.8 kg)   BMI 33.99 kg/m  Skin warm and dry. Neck: mid line trachea, normal thyroid, good ROM, no lymphadenopathy noted. Lungs: clear to ausculation bilaterally. Cardiovascular: regular rate and rhythm.AA is 1 Fall risk is low PHQ 9 score is 0  Upstream - 11/03/20 1211      Pregnancy Intention Screening   Does the patient want to become pregnant in the next year? N/A    Does the patient's partner want to become pregnant in the next year? N/A    Would the patient like to discuss contraceptive options today? N/A      Contraception Wrap Up   Current Method --   hyst   End Method --   hyst   Contraception Counseling Provided No             Assessment:     1. Hot flashes Will stop estrace Try estrogel, 3 bottles given    Meds ordered this encounter  Medications  . Estradiol (ESTROGEL) 0.75 MG/1.25 GM (0.06%) topical gel    Sig: Place 1.25 g onto the skin daily.    Dispense:  150 g    Refill:  0    Order Specific Question:   Supervising Provider    Answer:   14/10/21, LUTHER H [[id number]]    2. Night sweats  3. Vaginal dryness     Plan:     Follow up in 6 weeks

## 2020-11-10 DIAGNOSIS — S46211A Strain of muscle, fascia and tendon of other parts of biceps, right arm, initial encounter: Secondary | ICD-10-CM | POA: Diagnosis not present

## 2020-11-10 DIAGNOSIS — G8918 Other acute postprocedural pain: Secondary | ICD-10-CM | POA: Diagnosis not present

## 2020-11-10 DIAGNOSIS — M66821 Spontaneous rupture of other tendons, right upper arm: Secondary | ICD-10-CM | POA: Diagnosis not present

## 2020-11-10 DIAGNOSIS — X58XXXA Exposure to other specified factors, initial encounter: Secondary | ICD-10-CM | POA: Diagnosis not present

## 2020-11-10 DIAGNOSIS — Y999 Unspecified external cause status: Secondary | ICD-10-CM | POA: Diagnosis not present

## 2020-11-14 DIAGNOSIS — M66821 Spontaneous rupture of other tendons, right upper arm: Secondary | ICD-10-CM | POA: Diagnosis not present

## 2020-12-15 ENCOUNTER — Ambulatory Visit: Payer: BC Managed Care – PPO | Admitting: Adult Health

## 2021-01-09 ENCOUNTER — Ambulatory Visit: Payer: BC Managed Care – PPO | Admitting: Internal Medicine

## 2022-07-26 IMAGING — DX DG CHEST 2V
2 series · 2 of 2 positions shown · non-contrast
Comparison: PA and lateral chest 08/24/2009.

CLINICAL DATA: Right chest and back pain for 3 days.

EXAM:
CHEST - 2 VIEW

[chest pa]
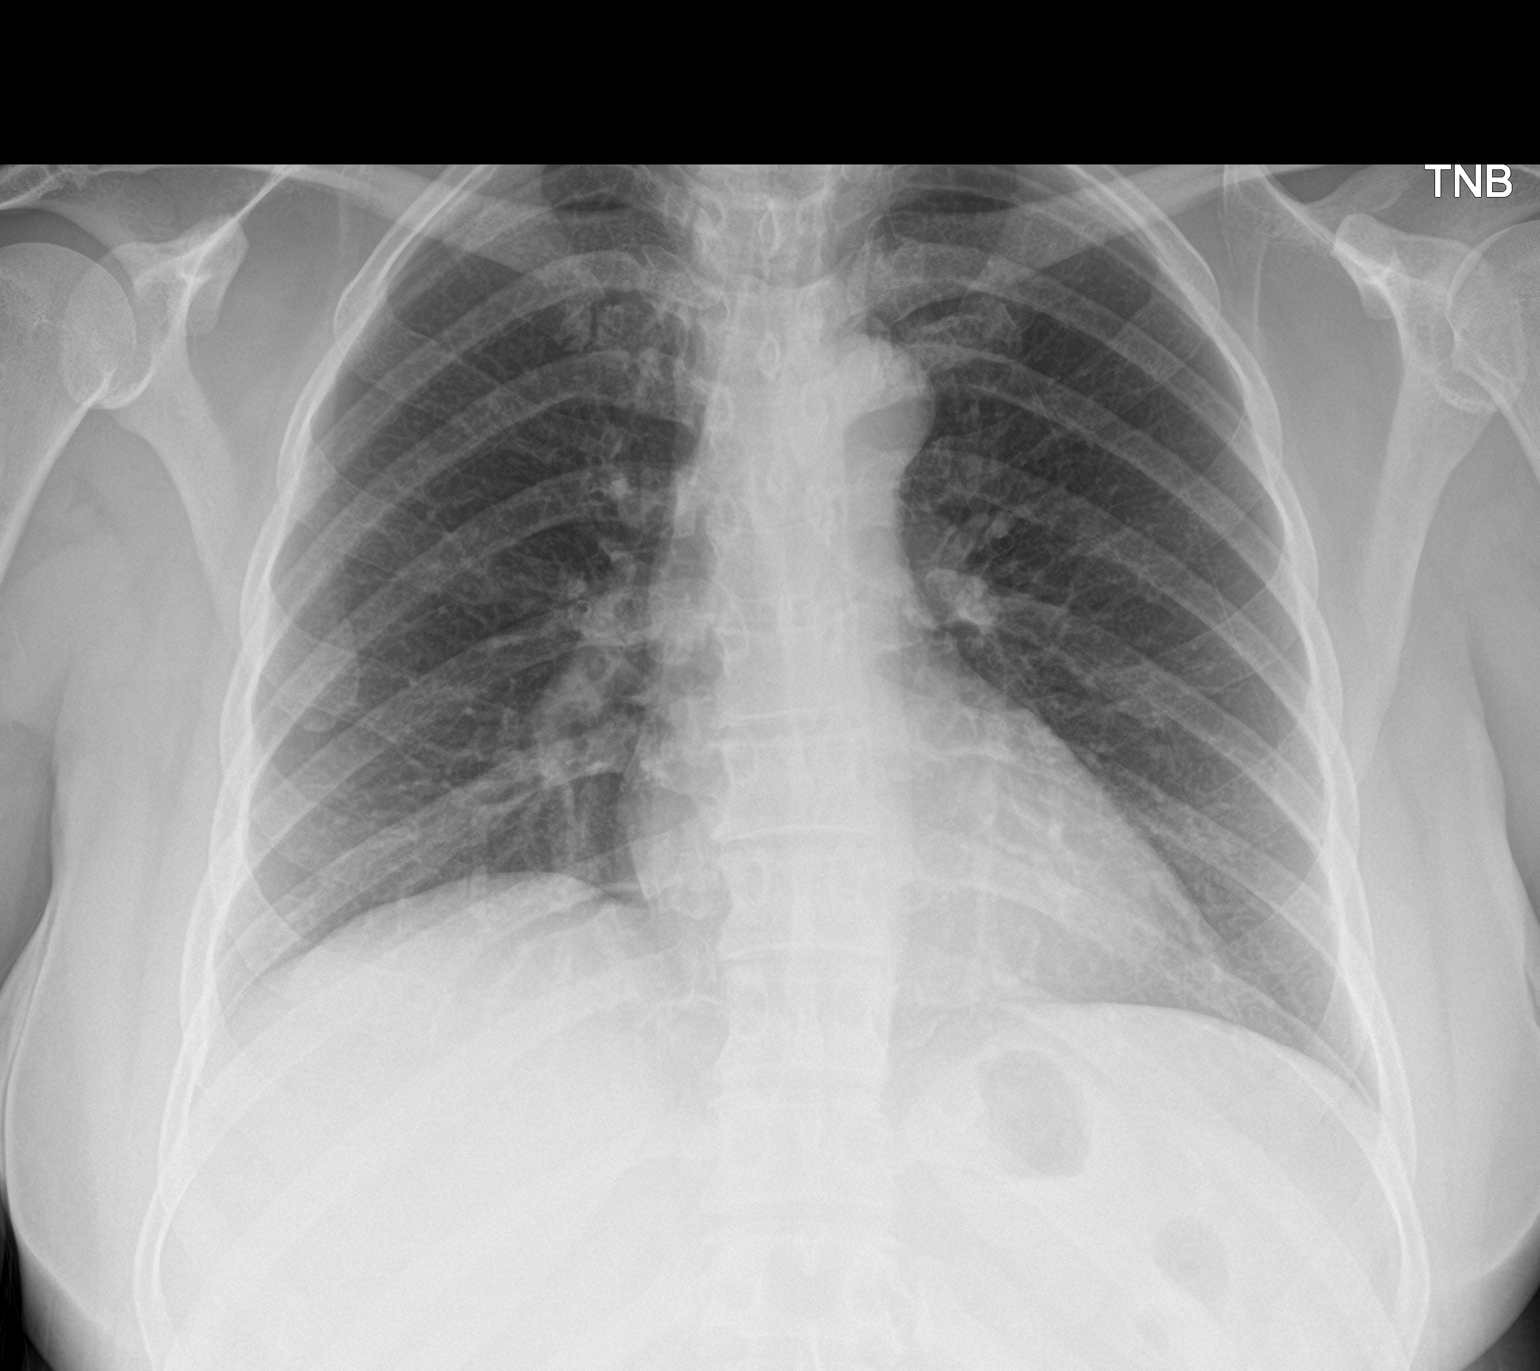

[chest lat]
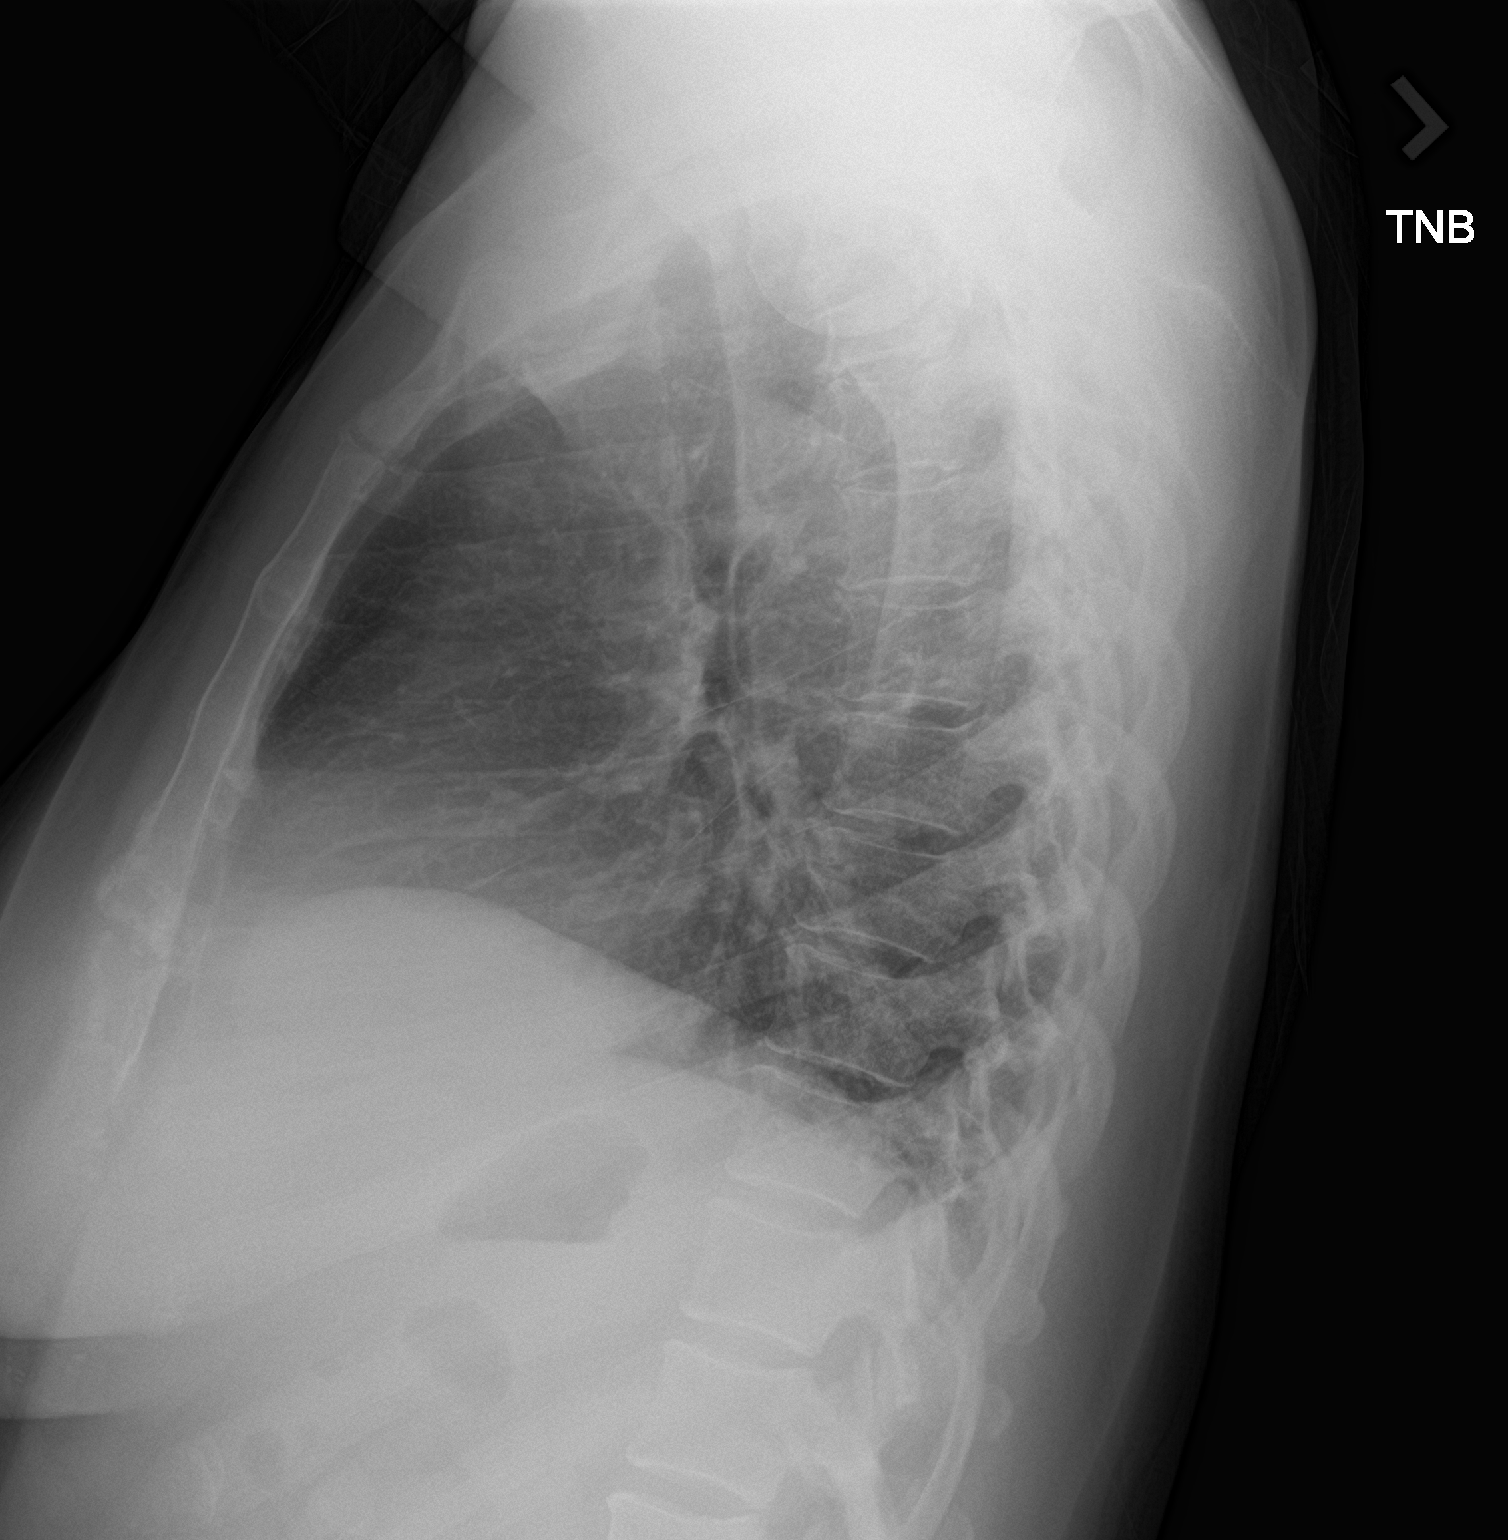

[2 of 2 positions shown; findings below may reference images not displayed]

FINDINGS: The lungs are clear. Heart size is normal. No pneumothorax or
pleural fluid. No acute or focal bony abnormality.
IMPRESSION: Negative chest.
